# Patient Record
Sex: Female | Born: 1965 | Race: White | Hispanic: No | Marital: Married | Smoking: Never smoker
Health system: Southern US, Community
[De-identification: ages and names within clinical notes are randomized; demographics above are authoritative.]

## PROBLEM LIST (undated history)

## (undated) DIAGNOSIS — M1990 Unspecified osteoarthritis, unspecified site: Secondary | ICD-10-CM

## (undated) DIAGNOSIS — C801 Malignant (primary) neoplasm, unspecified: Secondary | ICD-10-CM

---

## 2010-07-21 DIAGNOSIS — C801 Malignant (primary) neoplasm, unspecified: Secondary | ICD-10-CM

## 2010-07-21 HISTORY — DX: Malignant (primary) neoplasm, unspecified: C80.1

## 2010-07-21 HISTORY — PX: EXCISION MELANOMA WITH SENTINEL LYMPH NODE BIOPSY: SHX5628

## 2016-09-02 ENCOUNTER — Other Ambulatory Visit: Payer: Self-pay | Admitting: Obstetrics and Gynecology

## 2016-09-02 DIAGNOSIS — Z1231 Encounter for screening mammogram for malignant neoplasm of breast: Secondary | ICD-10-CM

## 2016-10-02 ENCOUNTER — Encounter: Payer: Self-pay | Admitting: Radiology

## 2016-10-02 ENCOUNTER — Ambulatory Visit
Admission: RE | Admit: 2016-10-02 | Discharge: 2016-10-02 | Disposition: A | Discharge status: Home / Self Care | Payer: BLUE CROSS/BLUE SHIELD | Source: Ambulatory Visit | Attending: Obstetrics and Gynecology | Admitting: Obstetrics and Gynecology

## 2016-10-02 DIAGNOSIS — Z1231 Encounter for screening mammogram for malignant neoplasm of breast: Secondary | ICD-10-CM

## 2016-10-06 ENCOUNTER — Inpatient Hospital Stay
Admission: RE | Admit: 2016-10-06 | Discharge: 2016-10-06 | Disposition: A | Discharge status: Home / Self Care | Payer: Self-pay | Source: Ambulatory Visit | Attending: *Deleted | Admitting: *Deleted

## 2016-10-06 ENCOUNTER — Other Ambulatory Visit: Payer: Self-pay | Admitting: *Deleted

## 2016-10-06 DIAGNOSIS — Z1231 Encounter for screening mammogram for malignant neoplasm of breast: Secondary | ICD-10-CM

## 2016-10-14 ENCOUNTER — Other Ambulatory Visit: Payer: Self-pay | Admitting: Obstetrics and Gynecology

## 2016-10-14 DIAGNOSIS — R928 Other abnormal and inconclusive findings on diagnostic imaging of breast: Secondary | ICD-10-CM

## 2016-10-14 DIAGNOSIS — R921 Mammographic calcification found on diagnostic imaging of breast: Secondary | ICD-10-CM

## 2016-10-16 ENCOUNTER — Ambulatory Visit
Admission: RE | Admit: 2016-10-16 | Discharge: 2016-10-16 | Disposition: A | Discharge status: Home / Self Care | Payer: BLUE CROSS/BLUE SHIELD | Source: Ambulatory Visit | Attending: Obstetrics and Gynecology | Admitting: Obstetrics and Gynecology

## 2016-10-16 DIAGNOSIS — R921 Mammographic calcification found on diagnostic imaging of breast: Secondary | ICD-10-CM

## 2016-10-16 DIAGNOSIS — R928 Other abnormal and inconclusive findings on diagnostic imaging of breast: Secondary | ICD-10-CM

## 2016-10-27 ENCOUNTER — Other Ambulatory Visit: Payer: Self-pay | Admitting: Obstetrics and Gynecology

## 2016-10-27 DIAGNOSIS — R921 Mammographic calcification found on diagnostic imaging of breast: Secondary | ICD-10-CM

## 2016-10-27 DIAGNOSIS — C000 Malignant neoplasm of external upper lip: Secondary | ICD-10-CM

## 2017-01-05 ENCOUNTER — Encounter
Admission: RE | Admit: 2017-01-05 | Discharge: 2017-01-05 | Disposition: A | Discharge status: Home / Self Care | Payer: BLUE CROSS/BLUE SHIELD | Source: Ambulatory Visit | Attending: Obstetrics and Gynecology | Admitting: Obstetrics and Gynecology

## 2017-01-05 DIAGNOSIS — Z01812 Encounter for preprocedural laboratory examination: Secondary | ICD-10-CM | POA: Insufficient documentation

## 2017-01-05 HISTORY — DX: Unspecified osteoarthritis, unspecified site: M19.90

## 2017-01-05 HISTORY — DX: Malignant (primary) neoplasm, unspecified: C80.1

## 2017-01-05 LAB — BASIC METABOLIC PANEL
Anion gap: 4 — ABNORMAL LOW (ref 5–15)
BUN: 13 mg/dL (ref 6–20)
CHLORIDE: 105 mmol/L (ref 101–111)
CO2: 26 mmol/L (ref 22–32)
CREATININE: 0.82 mg/dL (ref 0.44–1.00)
Calcium: 9.5 mg/dL (ref 8.9–10.3)
GFR calc Af Amer: >60 mL/min (ref >60)
GFR calc non Af Amer: >60 mL/min (ref >60)
Glucose, Bld: 88 mg/dL (ref 65–99)
Potassium: 4 mmol/L (ref 3.5–5.1)
SODIUM: 135 mmol/L (ref 135–145)

## 2017-01-05 LAB — CBC
HCT: 32.3 % — ABNORMAL LOW (ref 35.0–47.0)
Hemoglobin: 10.3 g/dL — ABNORMAL LOW (ref 12.0–16.0)
MCH: 25.3 pg — AB (ref 26.0–34.0)
MCHC: 31.8 g/dL — ABNORMAL LOW (ref 32.0–36.0)
MCV: 79.6 fL — AB (ref 80.0–100.0)
PLATELETS: 355 10*3/uL (ref 150–440)
RBC: 4.06 MIL/uL (ref 3.80–5.20)
RDW: 14 % (ref 11.5–14.5)
WBC: 7.9 10*3/uL (ref 3.6–11.0)

## 2017-01-05 NOTE — Patient Instructions (Signed)
  Your procedure is scheduled on: January 12, 2017 [facility]) Report to Same Day Surgery 2nd floor medical mall ([place] Entrance-take elevator on left to 2nd floor.  Check in with surgery information desk.) To find out your arrival time please call [phone] between 1PM - 3PM on January 09, 2017 (FRIDAY)   Remember: Instructions that are not followed completely may result in serious medical risk, up to and including death, or upon the discretion of your surgeon and anesthesiologist your surgery may need to be rescheduled.    _x___ 1. Do not eat food or drink liquids after midnight. No gum chewing or hard candies                                __x__ 2. No Alcohol for 24 hours before or after surgery.   __x__3. No Smoking for 24 prior to surgery.   ____  4. Bring all medications with you on the day of surgery if instructed.    __x__ 5. Notify your doctor if there is any change in your medical condition     (cold, fever, infections).     Do not wear jewelry, make-up, hairpins, clips or nail polish.  Do not wear lotions, powders, or perfumes.   Do not shave 48 hours prior to surgery. Men may shave face and neck.  Do not bring valuables to the hospital.    [facility] is not responsible for any belongings or valuables.               Contacts, dentures or bridgework may not be worn into surgery.  Leave your suitcase in the car. After surgery it may be brought to your room.  For patients admitted to the hospital, discharge time is determined by your treatment team                  .   Patients discharged the day of surgery will not be allowed to drive home.  You will need someone to drive you home and stay with you the night of your procedure.    Please read over the following fact sheets that you were given:   [facility] Preparing for Surgery and or MRSA Information   ___ Take anti-hypertensive (unless it includes a diuretic), cardiac, seizure, asthma,     anti-reflux and psychiatric  medicines. These include:  1.   2.  3.  4.  5.  6.  ____Fleets enema or Magnesium Citrate as directed.   _x___ Use CHG Soap or sage wipes as directed on instruction sheet   ____ Use inhalers on the day of surgery and bring to hospital day of surgery  ____ Stop Metformin and Janumet 2 days prior to surgery.    ____ Take 1/2 of usual insulin dose the night before surgery and none on the morning surgery     _x___ Follow recommendations from Cardiologist, Pulmonologist or PCP regarding          stopping Aspirin, Coumadin, Plavix ,Eliquis, Effient, or Pradaxa, and Pletal.  X____Stop Anti-inflammatories such as Advil, Aleve, Ibuprofen, Motrin, Naproxen, Naprosyn, Goodies powders or aspirin products. OK to take Tylenol   _x___ Stop supplements until after surgery.  But may continue Vitamin D, Vitamin B, and multivitamin       ____ Bring C-Pap to the hospital.

## 2017-01-11 MED ORDER — CEFAZOLIN SODIUM-DEXTROSE 2-4 GM/100ML-% IV SOLN
2.0000 g | INTRAVENOUS | Status: AC
  Administered 2017-01-12: 2 g via INTRAVENOUS

## 2017-01-12 ENCOUNTER — Ambulatory Visit
Admission: RE | Admit: 2017-01-12 | Discharge: 2017-01-12 | Disposition: A | Discharge status: Home / Self Care | Payer: BLUE CROSS/BLUE SHIELD | Source: Ambulatory Visit | Attending: Obstetrics and Gynecology | Admitting: Obstetrics and Gynecology

## 2017-01-12 ENCOUNTER — Ambulatory Visit: Payer: BLUE CROSS/BLUE SHIELD | Admitting: Anesthesiology

## 2017-01-12 ENCOUNTER — Encounter: Payer: Self-pay | Admitting: *Deleted

## 2017-01-12 ENCOUNTER — Encounter
Admission: RE | Disposition: A | Discharge status: Home / Self Care | Payer: Self-pay | Source: Ambulatory Visit | Attending: Obstetrics and Gynecology

## 2017-01-12 DIAGNOSIS — N800 Endometriosis of uterus: Secondary | ICD-10-CM | POA: Diagnosis not present

## 2017-01-12 DIAGNOSIS — Z85820 Personal history of malignant melanoma of skin: Secondary | ICD-10-CM | POA: Diagnosis not present

## 2017-01-12 DIAGNOSIS — D250 Submucous leiomyoma of uterus: Secondary | ICD-10-CM | POA: Insufficient documentation

## 2017-01-12 DIAGNOSIS — D252 Subserosal leiomyoma of uterus: Secondary | ICD-10-CM | POA: Diagnosis not present

## 2017-01-12 DIAGNOSIS — D251 Intramural leiomyoma of uterus: Secondary | ICD-10-CM | POA: Diagnosis not present

## 2017-01-12 DIAGNOSIS — N920 Excessive and frequent menstruation with regular cycle: Secondary | ICD-10-CM | POA: Diagnosis present

## 2017-01-12 HISTORY — PX: CYSTOSCOPY: SHX5120

## 2017-01-12 HISTORY — PX: BILATERAL SALPINGECTOMY: SHX5743

## 2017-01-12 HISTORY — PX: VAGINAL HYSTERECTOMY: SHX2639

## 2017-01-12 LAB — CBC WITH DIFFERENTIAL/PLATELET
Basophils Absolute: 0.1 10*3/uL (ref 0–0.1)
Basophils Relative: 1 %
EOS ABS: 0 10*3/uL (ref 0–0.7)
EOS PCT: 0 %
HCT: 24.9 % — ABNORMAL LOW (ref 35.0–47.0)
HEMOGLOBIN: 8 g/dL — AB (ref 12.0–16.0)
LYMPHS ABS: 1 10*3/uL (ref 1.0–3.6)
Lymphocytes Relative: 15 %
MCH: 25.5 pg — AB (ref 26.0–34.0)
MCHC: 31.9 g/dL — AB (ref 32.0–36.0)
MCV: 80 fL (ref 80.0–100.0)
MONOS PCT: 2 %
Monocytes Absolute: 0.2 10*3/uL (ref 0.2–0.9)
NEUTROS PCT: 82 %
Neutro Abs: 5.6 10*3/uL (ref 1.4–6.5)
Platelets: 283 10*3/uL (ref 150–440)
RBC: 3.12 MIL/uL — AB (ref 3.80–5.20)
RDW: 14.5 % (ref 11.5–14.5)
WBC: 6.9 10*3/uL (ref 3.6–11.0)

## 2017-01-12 LAB — ABO/RH: ABO/RH(D): O POS

## 2017-01-12 LAB — HEMOGLOBIN: Hemoglobin: 8.9 g/dL — ABNORMAL LOW (ref 12.0–16.0)

## 2017-01-12 LAB — PREPARE RBC (CROSSMATCH)

## 2017-01-12 LAB — POCT PREGNANCY, URINE: Preg Test, Ur: NEGATIVE

## 2017-01-12 SURGERY — HYSTERECTOMY, VAGINAL
Anesthesia: General | Wound class: Clean Contaminated

## 2017-01-12 MED ORDER — ROCURONIUM BROMIDE 100 MG/10ML IV SOLN
INTRAVENOUS | Status: DC | PRN
Start: 2017-01-12 — End: 2017-01-12
  Administered 2017-01-12: 50 mg via INTRAVENOUS
  Administered 2017-01-12: 20 mg via INTRAVENOUS
  Administered 2017-01-12: 10 mg via INTRAVENOUS
  Administered 2017-01-12: 20 mg via INTRAVENOUS

## 2017-01-12 MED ORDER — LIDOCAINE HCL (CARDIAC) 20 MG/ML IV SOLN
INTRAVENOUS | Status: DC | PRN
  Administered 2017-01-12: 60 mg via INTRAVENOUS

## 2017-01-12 MED ORDER — DEXAMETHASONE SODIUM PHOSPHATE 10 MG/ML IJ SOLN
INTRAMUSCULAR | Status: DC | PRN
  Administered 2017-01-12: 10 mg via INTRAVENOUS

## 2017-01-12 MED ORDER — OXYCODONE HCL 5 MG/5ML PO SOLN: 5.0000 mg | Freq: Once | ORAL | Status: DC | PRN

## 2017-01-12 MED ORDER — GABAPENTIN 300 MG PO CAPS
600.0000 mg | ORAL_CAPSULE | Freq: Once | ORAL | Status: AC
  Administered 2017-01-12: 600 mg via ORAL

## 2017-01-12 MED ORDER — EPHEDRINE SULFATE 50 MG/ML IJ SOLN
INTRAMUSCULAR | Status: AC
  Filled 2017-01-12: qty 1

## 2017-01-12 MED ORDER — FENTANYL CITRATE (PF) 100 MCG/2ML IJ SOLN
INTRAMUSCULAR | Status: AC
  Administered 2017-01-12: 25 ug via INTRAVENOUS
  Filled 2017-01-12: qty 2

## 2017-01-12 MED ORDER — CEFAZOLIN SODIUM-DEXTROSE 2-4 GM/100ML-% IV SOLN
INTRAVENOUS | Status: AC
  Filled 2017-01-12: qty 100

## 2017-01-12 MED ORDER — MIDAZOLAM HCL 2 MG/2ML IJ SOLN
INTRAMUSCULAR | Status: DC | PRN
  Administered 2017-01-12: 2 mg via INTRAVENOUS

## 2017-01-12 MED ORDER — ACETAMINOPHEN 500 MG PO TABS: 1000.0000 mg | ORAL_TABLET | Freq: Four times a day (QID) | ORAL | 0 refills | Status: AC

## 2017-01-12 MED ORDER — OXYCODONE HCL 5 MG PO TABS: 5.0000 mg | ORAL_TABLET | Freq: Once | ORAL | Status: DC | PRN

## 2017-01-12 MED ORDER — METHYLENE BLUE 0.5 % INJ SOLN
INTRAVENOUS | Status: AC
  Filled 2017-01-12: qty 10

## 2017-01-12 MED ORDER — FUROSEMIDE 10 MG/ML IJ SOLN
INTRAMUSCULAR | Status: AC
  Filled 2017-01-12: qty 4

## 2017-01-12 MED ORDER — ONDANSETRON HCL 4 MG/2ML IJ SOLN
INTRAMUSCULAR | Status: AC
  Filled 2017-01-12: qty 2

## 2017-01-12 MED ORDER — FENTANYL CITRATE (PF) 100 MCG/2ML IJ SOLN
INTRAMUSCULAR | Status: DC | PRN
Start: 2017-01-12 — End: 2017-01-12
  Administered 2017-01-12 (×4): 50 ug via INTRAVENOUS

## 2017-01-12 MED ORDER — PHENYLEPHRINE HCL 10 MG/ML IJ SOLN
INTRAMUSCULAR | Status: DC | PRN
  Administered 2017-01-12: 100 ug via INTRAVENOUS

## 2017-01-12 MED ORDER — LIDOCAINE-EPINEPHRINE 1 %-1:100000 IJ SOLN
INTRAMUSCULAR | Status: AC
  Filled 2017-01-12: qty 1

## 2017-01-12 MED ORDER — FAMOTIDINE 20 MG PO TABS
ORAL_TABLET | ORAL | Status: AC
  Administered 2017-01-12: 20 mg via ORAL
  Filled 2017-01-12: qty 1

## 2017-01-12 MED ORDER — GABAPENTIN 300 MG PO CAPS
ORAL_CAPSULE | ORAL | Status: AC
  Administered 2017-01-12: 600 mg via ORAL
  Filled 2017-01-12: qty 2

## 2017-01-12 MED ORDER — ACETAMINOPHEN 500 MG PO TABS
1000.0000 mg | ORAL_TABLET | Freq: Once | ORAL | Status: AC
  Administered 2017-01-12: 1000 mg via ORAL

## 2017-01-12 MED ORDER — ROCURONIUM BROMIDE 50 MG/5ML IV SOLN
INTRAVENOUS | Status: AC
  Filled 2017-01-12: qty 1

## 2017-01-12 MED ORDER — LIDOCAINE-EPINEPHRINE 1 %-1:100000 IJ SOLN
INTRAMUSCULAR | Status: DC | PRN
  Administered 2017-01-12: 10 mL

## 2017-01-12 MED ORDER — GABAPENTIN 800 MG PO TABS: 800.0000 mg | ORAL_TABLET | Freq: Every day | ORAL | 0 refills | Status: AC

## 2017-01-12 MED ORDER — ACETAMINOPHEN 500 MG PO TABS
ORAL_TABLET | ORAL | Status: AC
  Administered 2017-01-12: 1000 mg via ORAL
  Filled 2017-01-12: qty 2

## 2017-01-12 MED ORDER — FAMOTIDINE 20 MG PO TABS
20.0000 mg | ORAL_TABLET | Freq: Once | ORAL | Status: AC
  Administered 2017-01-12: 20 mg via ORAL

## 2017-01-12 MED ORDER — OXYCODONE HCL 5 MG PO CAPS: 5.0000 mg | ORAL_CAPSULE | Freq: Four times a day (QID) | ORAL | 0 refills | Status: AC | PRN

## 2017-01-12 MED ORDER — PROMETHAZINE HCL 25 MG/ML IJ SOLN
INTRAMUSCULAR | Status: AC
  Filled 2017-01-12: qty 1

## 2017-01-12 MED ORDER — PROPOFOL 10 MG/ML IV BOLUS
INTRAVENOUS | Status: AC
  Filled 2017-01-12: qty 20

## 2017-01-12 MED ORDER — FUROSEMIDE 10 MG/ML IJ SOLN
INTRAMUSCULAR | Status: DC | PRN
  Administered 2017-01-12: 10 mg via INTRAMUSCULAR

## 2017-01-12 MED ORDER — ONDANSETRON HCL 4 MG/2ML IJ SOLN
INTRAMUSCULAR | Status: AC
  Administered 2017-01-12: 4 mg via INTRAVENOUS
  Filled 2017-01-12: qty 2

## 2017-01-12 MED ORDER — LIDOCAINE HCL (PF) 2 % IJ SOLN
INTRAMUSCULAR | Status: AC
  Filled 2017-01-12: qty 2

## 2017-01-12 MED ORDER — PROPOFOL 10 MG/ML IV BOLUS
INTRAVENOUS | Status: DC | PRN
  Administered 2017-01-12: 130 mg via INTRAVENOUS
  Administered 2017-01-12: 70 mg via INTRAVENOUS

## 2017-01-12 MED ORDER — ONDANSETRON HCL 4 MG/2ML IJ SOLN
4.0000 mg | Freq: Once | INTRAMUSCULAR | Status: AC
  Administered 2017-01-12 (×2): 4 mg via INTRAVENOUS

## 2017-01-12 MED ORDER — IBUPROFEN 800 MG PO TABS: 800.0000 mg | ORAL_TABLET | Freq: Three times a day (TID) | ORAL | 0 refills | Status: AC

## 2017-01-12 MED ORDER — DEXAMETHASONE SODIUM PHOSPHATE 10 MG/ML IJ SOLN
INTRAMUSCULAR | Status: AC
  Filled 2017-01-12: qty 1

## 2017-01-12 MED ORDER — BUPIVACAINE HCL (PF) 0.5 % IJ SOLN
INTRAMUSCULAR | Status: AC
  Filled 2017-01-12: qty 30

## 2017-01-12 MED ORDER — LACTATED RINGERS IV SOLN
INTRAVENOUS | Status: DC
  Administered 2017-01-12 (×5): via INTRAVENOUS

## 2017-01-12 MED ORDER — SUGAMMADEX SODIUM 200 MG/2ML IV SOLN
INTRAVENOUS | Status: AC
  Filled 2017-01-12: qty 2

## 2017-01-12 MED ORDER — SODIUM CHLORIDE 0.9 % IJ SOLN
INTRAMUSCULAR | Status: AC
  Filled 2017-01-12: qty 10

## 2017-01-12 MED ORDER — SUGAMMADEX SODIUM 200 MG/2ML IV SOLN
INTRAVENOUS | Status: DC | PRN
  Administered 2017-01-12: 130 mg via INTRAVENOUS

## 2017-01-12 MED ORDER — MIDAZOLAM HCL 2 MG/2ML IJ SOLN
INTRAMUSCULAR | Status: AC
  Filled 2017-01-12: qty 2

## 2017-01-12 MED ORDER — PROMETHAZINE HCL 25 MG/ML IJ SOLN
6.2500 mg | Freq: Once | INTRAMUSCULAR | Status: AC
  Administered 2017-01-12: 6.25 mg via INTRAVENOUS

## 2017-01-12 MED ORDER — EPHEDRINE SULFATE 50 MG/ML IJ SOLN
INTRAMUSCULAR | Status: DC | PRN
  Administered 2017-01-12 (×3): 10 mg via INTRAVENOUS

## 2017-01-12 MED ORDER — FENTANYL CITRATE (PF) 250 MCG/5ML IJ SOLN
INTRAMUSCULAR | Status: AC
  Filled 2017-01-12: qty 5

## 2017-01-12 MED ORDER — ONDANSETRON HCL 4 MG/2ML IJ SOLN
INTRAMUSCULAR | Status: DC | PRN
  Administered 2017-01-12: 4 mg via INTRAVENOUS

## 2017-01-12 MED ORDER — FENTANYL CITRATE (PF) 100 MCG/2ML IJ SOLN
25.0000 ug | INTRAMUSCULAR | Status: DC | PRN
  Administered 2017-01-12 (×2): 25 ug via INTRAVENOUS

## 2017-01-12 SURGICAL SUPPLY — 71 items
BAG URO DRAIN 2000ML W/SPOUT (MISCELLANEOUS) ×4 IMPLANT
BLADE SURG SZ11 CARB STEEL (BLADE) ×4 IMPLANT
CATH FOLEY 2WAY  5CC 16FR (CATHETERS) ×1
CATH ROBINSON RED A/P 16FR (CATHETERS) IMPLANT
CATH URTH 16FR FL 2W BLN LF (CATHETERS) ×3 IMPLANT
CHLORAPREP W/TINT 26ML (MISCELLANEOUS) ×4 IMPLANT
CLEANER CAUTERY TIP 5X5 PAD (MISCELLANEOUS) ×3 IMPLANT
CORD MONOPOLAR M/FML 12FT (MISCELLANEOUS) IMPLANT
COUNTER NEEDLE 20/40 LG (NEEDLE) ×4 IMPLANT
DEFOGGER SCOPE WARMER CLEARIFY (MISCELLANEOUS) IMPLANT
DERMABOND ADVANCED (GAUZE/BANDAGES/DRESSINGS) ×1
DERMABOND ADVANCED .7 DNX12 (GAUZE/BANDAGES/DRESSINGS) ×3 IMPLANT
DRSG TEGADERM 2-3/8X2-3/4 SM (GAUZE/BANDAGES/DRESSINGS) IMPLANT
ENDOPOUCH RETRIEVER 10 (MISCELLANEOUS) IMPLANT
GAUZE SPONGE NON-WVN 2X2 STRL (MISCELLANEOUS) IMPLANT
GLOVE BIO SURGEON STRL SZ7 (GLOVE) ×8 IMPLANT
GLOVE INDICATOR 7.5 STRL GRN (GLOVE) ×4 IMPLANT
GOWN STRL REUS W/ TWL LRG LVL3 (GOWN DISPOSABLE) ×12 IMPLANT
GOWN STRL REUS W/ TWL XL LVL3 (GOWN DISPOSABLE) ×3 IMPLANT
GOWN STRL REUS W/TWL LRG LVL3 (GOWN DISPOSABLE) ×4
GOWN STRL REUS W/TWL XL LVL3 (GOWN DISPOSABLE) ×1
HANDLE YANKAUER SUCT BULB TIP (MISCELLANEOUS) ×4 IMPLANT
IRRIGATION STRYKERFLOW (MISCELLANEOUS) IMPLANT
IRRIGATOR STRYKERFLOW (MISCELLANEOUS)
IV NS 1000ML (IV SOLUTION)
IV NS 1000ML BAXH (IV SOLUTION) IMPLANT
KIT PINK PAD W/HEAD ARE REST (MISCELLANEOUS) ×4
KIT PINK PAD W/HEAD ARM REST (MISCELLANEOUS) ×3 IMPLANT
KIT RM TURNOVER CYSTO AR (KITS) ×4 IMPLANT
LABEL OR SOLS (LABEL) ×4 IMPLANT
LIGASURE BLUNT 5MM 37CM (INSTRUMENTS) IMPLANT
LIGASURE LAP MARYLAND 5MM 37CM (ELECTROSURGICAL) IMPLANT
MANIPULATOR VCARE LG CRV RETR (MISCELLANEOUS) IMPLANT
MANIPULATOR VCARE SML CRV RETR (MISCELLANEOUS) IMPLANT
MANIPULATOR VCARE STD CRV RETR (MISCELLANEOUS) IMPLANT
NEEDLE VERESS 14GA 120MM (NEEDLE) IMPLANT
NS IRRIG 500ML POUR BTL (IV SOLUTION) ×4 IMPLANT
OCCLUDER COLPOPNEUMO (BALLOONS) IMPLANT
PACK GYN LAPAROSCOPIC (MISCELLANEOUS) ×4 IMPLANT
PAD CLEANER CAUTERY TIP 5X5 (MISCELLANEOUS) ×1
PAD OB MATERNITY 4.3X12.25 (PERSONAL CARE ITEMS) ×4 IMPLANT
PAD PREP 24X41 OB/GYN DISP (PERSONAL CARE ITEMS) ×4 IMPLANT
PENCIL ELECTRO HAND CTR (MISCELLANEOUS) ×4 IMPLANT
SCISSORS METZENBAUM CVD 33 (INSTRUMENTS) IMPLANT
SET CYSTO W/LG BORE CLAMP LF (SET/KITS/TRAYS/PACK) ×4 IMPLANT
SLEEVE ENDOPATH XCEL 5M (ENDOMECHANICALS) IMPLANT
SPONGE VERSALON 2X2 STRL (MISCELLANEOUS)
STRIP CLOSURE SKIN 1/4X4 (GAUZE/BANDAGES/DRESSINGS) IMPLANT
SUT MNCRL 4-0 (SUTURE)
SUT MNCRL 4-0 27XMFL (SUTURE)
SUT MNCRL AB 4-0 PS2 18 (SUTURE) IMPLANT
SUT PDS AB 2-0 CT1 27 (SUTURE) IMPLANT
SUT PDS PLUS 2 (SUTURE) ×2
SUT PDS PLUS AB 2-0 CT-1 (SUTURE) ×6 IMPLANT
SUT VIC AB 0 CT1 27 (SUTURE) ×4
SUT VIC AB 0 CT1 27XCR 8 STRN (SUTURE) ×12 IMPLANT
SUT VIC AB 0 CT1 36 (SUTURE) ×4 IMPLANT
SUT VIC AB 2-0 UR6 27 (SUTURE) IMPLANT
SUT VIC AB 3-0 SH 27 (SUTURE) ×3
SUT VIC AB 3-0 SH 27X BRD (SUTURE) ×9 IMPLANT
SUT VIC AB 4-0 SH 27 (SUTURE)
SUT VIC AB 4-0 SH 27XANBCTRL (SUTURE) IMPLANT
SUTURE MNCRL 4-0 27XMF (SUTURE) IMPLANT
SWABSTK COMLB BENZOIN TINCTURE (MISCELLANEOUS) IMPLANT
SYR 50ML LL SCALE MARK (SYRINGE) IMPLANT
SYRINGE 10CC LL (SYRINGE) ×4 IMPLANT
TROCAR ENDO BLADELESS 11MM (ENDOMECHANICALS) IMPLANT
TROCAR XCEL NON-BLD 5MMX100MML (ENDOMECHANICALS) IMPLANT
TROCAR XCEL UNIV SLVE 11M 100M (ENDOMECHANICALS) IMPLANT
TUBING INSUF HEATED (TUBING) ×4 IMPLANT
TUBING INSUFFLATOR HI FLOW (MISCELLANEOUS) IMPLANT

## 2017-01-12 NOTE — Progress Notes (Signed)
zofran given for nausea  

## 2017-01-12 NOTE — Progress Notes (Signed)
zofran helped

## 2017-01-12 NOTE — Discharge Instructions (Addendum)
Discharge instructions after  vaginal hysterectomy  Signs and Symptoms to Report Call our office at [phone] if you have any of the following.   Fever over 100.4 degrees or higher  Severe stomach pain not relieved with pain medications  Bright red bleeding thats heavier than a period that does not slow with rest  To go the bathroom a lot (frequency), you cant hold your urine (urgency), or it hurts when you empty your bladder (urinate)  Chest pain  Shortness of breath  Pain in the calves of your legs  Severe nausea and vomiting not relieved with anti-nausea medications  Signs of infection around your wounds, such as redness, hot to touch, swelling, green/yellow drainage (like pus), bad smelling discharge  Any concerns  What You Can Expect after Surgery  You may see some pink tinged, bloody fluid and bruising around the wound. This is normal.  You may have a sore throat because of the tube in your mouth during general anesthesia. This will go away in 2 to 3 days.  You may have some stomach cramps.  You may notice spotting on your panties.   Activities after Your Discharge Follow these guidelines to help speed your recovery at home:  Do the coughing and deep breathing as you did in the hospital for 2 weeks. Use the small blue breathing device, called the incentive spirometer for 2 weeks.  Dont drive if you are in pain or taking narcotic pain medicine. You may drive when you can safely slam on the brakes, turn the wheel forcefully, and rotate your torso comfortably. This is typically 1-2 weeks. Practice in a parking lot or side street prior to attempting to drive regularly.   Ask others to help with household chores for 4 weeks.  Do not lift anything heavier that 10 pounds for 4-6 weeks. This includes pets, children, and groceries.  Dont do strenuous activities, exercises, or sports like vacuuming, tennis, squash, etc. until your doctor says it is safe to do  so. ---Maintain pelvic rest for 8 weeks. This means nothing in the vagina at all (no douching, tampons, intercourse) for 8 weeks.   Walk as you feel able. Rest often since it may take two or three weeks for your energy level to return to normal.   You may climb stairs  Avoid constipation:   -Eat fruits, vegetables, and whole grains. Eat small meals as your appetite will take time to return to normal.   -Drink 6 to 8 glasses of water each day unless your doctor has told you to limit your fluids.   -Use a laxative or stool softener as needed if constipation becomes a problem. You may take Miralax, metamucil, Citrucil, Colace, Senekot, FiberCon, etc. If this does not relieve the constipation, try two tablespoons of Milk Of Magnesia every 8 hours until your bowels move.   You may shower. Gently wash the wounds with a mild soap and water. Pat dry.  Do not get in a hot tub, swimming pool, etc. for 6 weeks.  Do not use lotions, oils, powders on the wounds.  Do not douche, use tampons, or have sex until your doctor says it is okay.  Take your pain medicine when you need it. The medicine may not work as well if the pain is bad.  Take the medicines you were taking before surgery. Other medications you will need are pain medications (Norco or Percocet) and nausea medications (Zofran).     AMBULATORY SURGERY  DISCHARGE INSTRUCTIONS  1) The drugs that you were given will stay in your system until tomorrow so for the next 24 hours you should not:  A) Drive an automobile B) Make any legal decisions C) Drink any alcoholic beverage   2) You may resume regular meals tomorrow.  Today it is better to start with liquids and gradually work up to solid foods.  You may eat anything you prefer, but it is better to start with liquids, then soup and crackers, and gradually work up to solid foods.   3) Please notify your doctor immediately if you have any unusual bleeding, trouble breathing, redness  and pain at the surgery site, drainage, fever, or pain not relieved by medication.    4) Additional Instructions:        Please contact your physician with any problems or Same Day Surgery at [phone], Monday through Friday 6 am to 4 pm, or [place] [facility] number at [phone].

## 2017-01-12 NOTE — Progress Notes (Signed)
Pt states she feels better after vomiting   Taking ice chips well

## 2017-01-12 NOTE — Progress Notes (Signed)
Called results to dr Leafy Ro

## 2017-01-12 NOTE — Anesthesia Preprocedure Evaluation (Signed)
Anesthesia Evaluation  Patient identified by MRN, date of birth, ID band Patient awake    Reviewed: Allergy & Precautions, H&P , NPO status , Patient's Chart, lab work & pertinent test results  History of Anesthesia Complications Negative for: history of anesthetic complications  Airway Mallampati: III  TM Distance: <3 FB Neck ROM: full    Dental  (+) Chipped   Pulmonary neg pulmonary ROS, neg shortness of breath,           Cardiovascular Exercise Tolerance: Good (-) angina(-) Past MI and (-) DOE negative cardio ROS       Neuro/Psych negative neurological ROS  negative psych ROS   GI/Hepatic negative GI ROS, Neg liver ROS, neg GERD  ,  Endo/Other  negative endocrine ROS  Renal/GU      Musculoskeletal   Abdominal   Peds  Hematology negative hematology ROS (+)   Anesthesia Other Findings Past Medical History: No date: Arthritis 2012: Cancer ([place])     Comment: Melanoma Left Arm  Past Surgical History: 2012: EXCISION MELANOMA WITH SENTINEL LYMPH NODE BIO* Left     Comment: [facility]  BMI    Body Mass Index:  22.44 kg/m      Reproductive/Obstetrics negative OB ROS                             Anesthesia Physical Anesthesia Plan  ASA: II  Anesthesia Plan: General ETT   Post-op Pain Management:    Induction: Intravenous  PONV Risk Score and Plan: 4 or greater and Ondansetron, Dexamethasone, Propofol and Midazolam  Airway Management Planned: Oral ETT  Additional Equipment:   Intra-op Plan:   Post-operative Plan: Extubation in OR  Informed Consent: I have reviewed the patients History and Physical, chart, labs and discussed the procedure including the risks, benefits and alternatives for the proposed anesthesia with the patient or authorized representative who has indicated his/her understanding and acceptance.   Dental Advisory Given  Plan Discussed with:  Anesthesiologist, CRNA and Surgeon  Anesthesia Plan Comments: (Patient consented for risks of anesthesia including but not limited to:  - adverse reactions to medications - damage to teeth, lips or other oral mucosa - sore throat or hoarseness - Damage to heart, brain, lungs or loss of life  Patient voiced understanding.)        Anesthesia Quick Evaluation

## 2017-01-12 NOTE — Progress Notes (Signed)
Incontinent  when coughs

## 2017-01-12 NOTE — Anesthesia Procedure Notes (Signed)
Procedure Name: Intubation Date/Time: 01/12/2017 11:40 AM Performed by: Silvana Newness Pre-anesthesia Checklist: Patient identified, Emergency Drugs available, Suction available, Patient being monitored and Timeout performed Patient Re-evaluated:Patient Re-evaluated prior to inductionOxygen Delivery Method: Circle system utilized Preoxygenation: Pre-oxygenation with 100% oxygen Intubation Type: IV induction Ventilation: Mask ventilation without difficulty Laryngoscope Size: Mac and 3 Grade View: Grade I Tube type: Oral Tube size: 7.0 mm Number of attempts: 1 Airway Equipment and Method: Stylet Placement Confirmation: ETT inserted through vocal cords under direct vision,  positive ETCO2 and breath sounds checked- equal and bilateral Secured at: 19 cm Tube secured with: Tape Dental Injury: Teeth and Oropharynx as per pre-operative assessment

## 2017-01-12 NOTE — Progress Notes (Signed)
Blood drawn.

## 2017-01-12 NOTE — Progress Notes (Signed)
Dr Leafy Ro in see pt  Aware of hemogoblin

## 2017-01-12 NOTE — OR Nursing (Signed)
Dr. Leafy Ro in to see pt and advises ok to d/c to home, pt without nausea, sipping g ale/ice chips, few bites saltines.  Will d/c when pt comes back from pharmacy.

## 2017-01-12 NOTE — Progress Notes (Signed)
Incontinent of urine

## 2017-01-12 NOTE — Transfer of Care (Signed)
Immediate Anesthesia Transfer of Care Note  Patient: Veronica Walsh  Procedure(s) Performed: Procedure(s): HYSTERECTOMY VAGINAL BILATERAL SALPINGECTOMY CYSTOSCOPY  Patient Location: PACU  Anesthesia Type:General  Level of Consciousness: awake, drowsy and patient cooperative  Airway & Oxygen Therapy: Patient Spontanous Breathing and Patient connected to face mask oxygen  Post-op Assessment: Report given to RN, Post -op Vital signs reviewed and stable and Patient moving all extremities X 4  Post vital signs: Reviewed and stable  Last Vitals:  Vitals:   01/12/17 0907 01/12/17 1505  BP: 126/68   Pulse: (!) 58 96  Resp: 16 13  Temp: 36.6 C 36.2 C    Last Pain:  Vitals:   01/12/17 0907  TempSrc: Oral         Complications: No apparent anesthesia complications

## 2017-01-12 NOTE — Progress Notes (Signed)
Pt stated that she is not hurting enough for pain medication but there is pressure  Has voided a few times

## 2017-01-12 NOTE — Op Note (Signed)
Jerl Santos PROCEDURE DATE: 01/12/2017  PREOPERATIVE DIAGNOSIS:   Abnormal uterine bleeding - fibroids, failed medical management  POSTOPERATIVE DIAGNOSIS:   Same  SURGEON:   Benjaman Kindler, M.D. ASSISTANT: Larey Days, M.D. OPERATION:  Total Vaginal Hysterectomy, bilateral salpingectomy, cystoscopy  ANESTHESIA:  General endotracheal. Anesthesiologist: Anesthesiologist: Piscitello, Precious Haws, MD CRNA: Allean Found, CRNA; Silvana Newness, CRNA  INDICATIONS: The patient is a 51 y.o. with history of AUB-F. The patient made a decision to undergo definite surgical treatment. On the preoperative visit, the risks, benefits, indications, and alternatives of the procedure were reviewed with the patient.  On the day of surgery, the risks of surgery were again discussed with the patient including but not limited to: bleeding which may require transfusion or reoperation; infection which may require antibiotics; injury to bowel, bladder, ureters or other surrounding organs; need for additional procedures; thromboembolic phenomenon, incisional problems and other postoperative/anesthesia complications. Written informed consent was obtained.    OPERATIVE FINDINGS: A 10 week size uterus with normal tubes and ovaries bilaterally.  ESTIMATED BLOOD LOSS: 1200 ml FLUIDS:  2000 ml of Lactated Ringers URINE OUTPUT:  500 ml of clear yellow urine. SPECIMENS:  Uterus and cervix  sent to pathology COMPLICATIONS:  Blood loss during fibroid resection, with an intraop CBC drawn. Her initial H/H 10.3/32.3; intraop, 8.0/24.9. The decision was made to proceed with transfusion in the PACU.  DESCRIPTION OF PROCEDURE:  The patient received prophylactic intravenous antibiotics and had sequential compression devices applied to her lower extremities while in the preoperative area.    She was taken to the operating room, where she was identified by name and birth date. General anesthesia was administered and was found to  be adequate.  She was placed in the dorsal lithotomy position, and was prepped and draped in a sterile manner.  A formal time out procedure was performed with all team members present and in agreement. A Foley catheter was inserted into her bladder and attached to gravity drainage. Attention was turned to her pelvis. Of note, all sutures used in this case were 0 Vicryl unless otherwise noted.     A weighted speculum was placed in the vagina, and the anterior and posterior lips of the cervix were grasped bilaterally with thyroid tenaculums.  The cervix was then injected circumferentially with 0.25% Marcaine with epinephrine solution to maintain hemostasis.  The cervix was circumferentially incised using electrocautery, and the posterior cul-de-sac was entered sharply in the midline.   A long weighted speculum was inserted into the posterior cul-de-sac. The bladder was dissected off the pubocervical fascia anteriorly with sharp and careful blunt dissection without complication.  The anterior cul-de-sac was then entered sharply without difficulty and the bladder retracted out of the operative field behind a retractor. The Heaney clamp was then used to clamp the uterosacral ligaments on either side.  They were then cut and sutured ligated with 0 Vicryl, and the ligated uterosacral ligaments were transfixed to the ipsilateral vaginal epithelium to further support the vagina and provide hemostasis. The cardinal ligaments were then clamped, cut and ligated. The uterine vessels and broad ligaments were then serially clamped with the Heaney clamps, cut, and suture ligated on both sides.  Because of the size of the fundal fibroid, the posterior uterus was delivered and cored out, with multiple fibroids and sections of the uterus removed. This stage of the hysterectomy is where the bleeding occurred. We moved quickly but safely, making sure to keep the bladder out of the field, and careful control  of the cornual pedicles.  Once the uterine ovarian ligaments were controlled, her bleeding became minimal. Excellent hemostasis was noted at this point.    The uterus was then delivered via the posterior cul-de-sac, and the cornua were clamped with the Heaney clamps, transected, and the uterine specimen was delivered and sent to pathology. These pedicles were then suture ligated to ensure hemostasis.   BILATERAL SALPINGECTOMY PARAGRAPH The Fallopian tubes were then identified and individually grasped with Babcock clamps. They were clamped across entirely with a Heaney clamp and free-tied, followed by suture ligation for excellent hemostasis bilaterally. The ovaries were left intact and in place.  After completion of the hysterectomy, all pedicles from the uterosacral ligament to the cornua were examined hemostasis was confirmed.  The peritoneum was closed in a purse string fashion with 2-0 PDS, taking care not to incorporate any intraabdominal organs in the closure.The vaginal cuff was then closed with in a running locked fashion with care given to incorporate the uterosacral pedicles bilaterally, which were also tied in the midline.  All instruments were then removed from the pelvis.  CYSTOCOPY PARAGRAPH The Foley catheter was temporarily removed and an uncomplicated cystoscopy was performed. Excellent efflux was noted from both ureteral orifices. The bladder mucosa was intact. The Foley catheter was replaced for postoperative care.  The patient tolerated the procedure well.  All instruments, needles, and sponge counts were correct x 2. The patient was taken to the recovery room in stable condition.    Angelina Pih, MD, MPH

## 2017-01-12 NOTE — Anesthesia Post-op Follow-up Note (Cosign Needed)
Anesthesia QCDR form completed.        

## 2017-01-12 NOTE — H&P (Signed)
Veronica Walsh is a 51 y.o. female presenting with Follow-up (f/u ocp's, fibroids)  on 12/04/2016  HPI: Hx of AUB- F (11cm uterus) and pelvic pressure. Trial of micronor x2 months unsuccessful, and bleeding and pain are worsening.  Ultrasound measured 3/18:  Ut anteverted Multiple fibroids seen: largest = 5.5cm, one fibroid seen within endometrial cavity = 2.1cm  Endo = 6.33mm  No FF seen in CDSs  Bilateral adns imaged, appear WNL Could not identify ovs  NSVD x1  Pap smear  DIAGNOSIS: - [facility] Comment   Final  NEGATIVE FOR INTRAEPITHELIAL LESION AND MALIGNANCY.  Specimen adequacy: - [facility] Comment   Final  Satisfactory for evaluation. Endocervical and/or squamous metaplastic  cells (endocervical component) are present.  Clinician provided ICD10: - [facility] Comment   Final  Z01.419  PERFORMED BY: - [facility] Comment   Final  Alwyn Ren, Cytotechnologist (ASCP)  . - [facility] .   Final  Note: - [facility] Comment   Final  The Pap smear is a screening test designed to aid in the detection of  premalignant and malignant conditions of the uterine cervix. It is not a  diagnostic procedure and should not be used as the sole means of detecting  cervical cancer. Both false-positive and false-negative reports do occur.  Test Methodology: - [facility] Comment   Final  This liquid based ThinPrep(R) pap test was screened with the  use of an image guided system.  HPV, high-risk - [facility] Negative  Negative Final  This high-risk HPV test detects thirteen high-risk types  (16/18/31/33/35/39/45/51/52/56/58/59/68) without differentiation.    Past Medical History:  has a past medical history of Melanoma in situ of upper extremity, left (CMS-HCC).  Past Surgical History:  has no past surgical history on file. Family History: family history is not on file. Social History:  reports that she has never smoked. She has never used smokeless tobacco. She reports that she does not drink  alcohol or use drugs. OB/GYN History:          OB History    Gravida Para Term Preterm AB Living   1 1 1     1    SAB TAB Ectopic Molar Multiple Live Births                    Allergies: has No Known Allergies. Medications:  Current Outpatient Prescriptions:  .  norethindrone (MICRONOR) 0.35 mg tablet, Take 1 tablet (0.35 mg total) by mouth once daily., Disp: 1 Package, Rfl: 11   Review of Systems: No SOB, no palpitations or chest pain, no new lower extremity edema, no nausea or vomiting or bowel or bladder complaints. See HPI for gyn specific ROS.   Exam:   BP (!) 131/90   Pulse 77   Ht 167.6 cm (5\' 6" )   Wt 65 kg (143 lb 3.2 oz)   BMI 23.11 kg/m   General: Patient is well-groomed, well-nourished, appears stated age in no acute distress  HEENT: head is atraumatic and normocephalic, trachea is midline, neck is supple with no palpable nodules  CV: Regular rhythm and normal heart rate, no murmur  Pulm: Clear to auscultation throughout lung fields with no wheezing, crackles, or rhonchi. No increased work of breathing  Abdomen: soft , no mass, non-tender, no rebound tenderness, no hepatomegaly  Pelvic: tanner stage 5 ,              External genitalia: vulva /labia no lesions  Urethra: no prolapse             Vagina: normal physiologic d/c, laxity in vaginal walls             Cervix: no lesions, no cervical motion tenderness, good descent             Uterus: normal size shape and contour, non-tender             Adnexa: no mass,  non-tender               Rectovaginal: External wnl   Impression:   The primary encounter diagnosis was Intramural, submucous, and subserous leiomyoma of uterus. Diagnoses of Excessive or frequent menstruation and Pelvic pain in female were also pertinent to this visit.    Plan:    She plans to proceed withtotal laparoscopic hysterectomy with bilateral salpingectomy  procedure. We will perform a  cystoscopy to evaluate the urinary tract after the procedure.   The patient and I discussed the technical aspects of the procedure including the potential for risks and complications.  These include but are not limited to the risk of infection requiring post-operative antibiotics or further procedures.  We talked about the risk of injury to adjacent organs including bladder, bowel, ureter, blood vessels or nerves.  We talked about the need to convert to an open incision.  We talked about the possible need for blood transfusion.  We talked about postop complications such as thromboembolic or cardiopulmonary complications.  All of her questions were answered.  Her preoperative exam was completed and the appropriate consents were signed. She is scheduled to undergo this procedure in the near future.

## 2017-01-12 NOTE — Progress Notes (Signed)
Vomited about 200cc of bile  States she feels better after she vomited   Clears throat frequently

## 2017-01-12 NOTE — Progress Notes (Signed)
Phenergan 6.25 given for nausea  

## 2017-01-13 ENCOUNTER — Encounter: Payer: Self-pay | Admitting: Obstetrics and Gynecology

## 2017-01-13 LAB — BPAM RBC
Blood Product Expiration Date: 201806272359
ISSUE DATE / TIME: 201806251503
Unit Type and Rh: 9500

## 2017-01-13 LAB — TYPE AND SCREEN
ABO/RH(D): O POS
Antibody Screen: NEGATIVE
Unit division: 0

## 2017-01-13 NOTE — Anesthesia Postprocedure Evaluation (Signed)
Anesthesia Post Note  Patient: Veronica Walsh  Procedure(s) Performed: Procedure(s): HYSTERECTOMY VAGINAL BILATERAL SALPINGECTOMY CYSTOSCOPY  Patient location during evaluation: PACU Anesthesia Type: General Level of consciousness: awake and alert Pain management: pain level controlled Vital Signs Assessment: post-procedure vital signs reviewed and stable Respiratory status: spontaneous breathing, nonlabored ventilation, respiratory function stable and patient connected to nasal cannula oxygen Cardiovascular status: blood pressure returned to baseline and stable Postop Assessment: no signs of nausea or vomiting Anesthetic complications: no     Last Vitals:  Vitals:   01/12/17 1820 01/12/17 1900  BP: 108/60 107/78  Pulse: (!) 50 (!) 59  Resp: 16 16  Temp: 36.4 C 36.4 C    Last Pain:  Vitals:   01/12/17 1900  TempSrc: Temporal  PainSc:                  Precious Haws Saurabh Hettich

## 2017-01-14 LAB — SURGICAL PATHOLOGY

## 2017-04-20 ENCOUNTER — Ambulatory Visit
Admission: RE | Admit: 2017-04-20 | Discharge: 2017-04-20 | Disposition: A | Discharge status: Home / Self Care | Payer: BLUE CROSS/BLUE SHIELD | Source: Ambulatory Visit | Attending: Obstetrics and Gynecology | Admitting: Obstetrics and Gynecology

## 2017-04-20 ENCOUNTER — Other Ambulatory Visit: Payer: Self-pay | Admitting: Obstetrics and Gynecology

## 2017-04-20 ENCOUNTER — Encounter (HOSPITAL_COMMUNITY): Payer: Self-pay

## 2017-04-20 DIAGNOSIS — R921 Mammographic calcification found on diagnostic imaging of breast: Secondary | ICD-10-CM

## 2017-04-20 DIAGNOSIS — C000 Malignant neoplasm of external upper lip: Secondary | ICD-10-CM | POA: Insufficient documentation

## 2018-04-07 ENCOUNTER — Other Ambulatory Visit: Payer: Self-pay | Admitting: Obstetrics and Gynecology

## 2018-04-07 DIAGNOSIS — R921 Mammographic calcification found on diagnostic imaging of breast: Secondary | ICD-10-CM

## 2018-04-16 ENCOUNTER — Ambulatory Visit
Admission: RE | Admit: 2018-04-16 | Discharge: 2018-04-16 | Disposition: A | Discharge status: Home / Self Care | Payer: BLUE CROSS/BLUE SHIELD | Source: Ambulatory Visit | Attending: Obstetrics and Gynecology | Admitting: Obstetrics and Gynecology

## 2018-04-16 DIAGNOSIS — R921 Mammographic calcification found on diagnostic imaging of breast: Secondary | ICD-10-CM

## 2019-03-15 ENCOUNTER — Other Ambulatory Visit: Payer: Self-pay | Admitting: Internal Medicine

## 2019-03-17 ENCOUNTER — Other Ambulatory Visit: Payer: Self-pay | Admitting: Obstetrics and Gynecology

## 2019-03-17 DIAGNOSIS — Z1231 Encounter for screening mammogram for malignant neoplasm of breast: Secondary | ICD-10-CM

## 2019-03-17 DIAGNOSIS — N6001 Solitary cyst of right breast: Secondary | ICD-10-CM

## 2019-03-17 DIAGNOSIS — R921 Mammographic calcification found on diagnostic imaging of breast: Secondary | ICD-10-CM

## 2019-04-19 ENCOUNTER — Ambulatory Visit
Admission: RE | Admit: 2019-04-19 | Discharge: 2019-04-19 | Disposition: A | Discharge status: Home / Self Care | Payer: BC Managed Care – PPO | Source: Ambulatory Visit | Attending: Obstetrics and Gynecology | Admitting: Obstetrics and Gynecology

## 2019-04-19 DIAGNOSIS — N6001 Solitary cyst of right breast: Secondary | ICD-10-CM | POA: Diagnosis present

## 2019-04-19 DIAGNOSIS — R921 Mammographic calcification found on diagnostic imaging of breast: Secondary | ICD-10-CM | POA: Diagnosis present

## 2019-04-19 DIAGNOSIS — Z1231 Encounter for screening mammogram for malignant neoplasm of breast: Secondary | ICD-10-CM | POA: Diagnosis present

## 2019-10-24 ENCOUNTER — Ambulatory Visit: Payer: BC Managed Care – PPO | Attending: Internal Medicine

## 2019-10-24 ENCOUNTER — Other Ambulatory Visit: Payer: Self-pay

## 2019-10-24 DIAGNOSIS — Z23 Encounter for immunization: Secondary | ICD-10-CM

## 2019-10-24 NOTE — Progress Notes (Signed)
   Covid-19 Vaccination Clinic  Name:  Veronica Walsh    MRN: [id number] DOB: 08-04-1965  10/24/2019  [name] was observed post Covid-19 immunization for 15 minutes without incident. She was provided with Vaccine Information Sheet and instruction to access the V-Safe system.   [name] was instructed to call 911 with any severe reactions post vaccine: Marland Kitchen Difficulty breathing  . Swelling of face and throat  . A fast heartbeat  . A bad rash all over body  . Dizziness and weakness   Immunizations Administered    Name Date Dose VIS Date Route   Pfizer COVID-19 Vaccine 10/24/2019  9:17 AM 0.3 mL 07/01/2019 Intramuscular   Manufacturer: [place]   Lot: [id number]   [place]: [id number]

## 2019-11-16 ENCOUNTER — Ambulatory Visit: Payer: BC Managed Care – PPO | Attending: Internal Medicine

## 2019-11-16 DIAGNOSIS — Z23 Encounter for immunization: Secondary | ICD-10-CM

## 2019-11-16 NOTE — Progress Notes (Signed)
   Covid-19 Vaccination Clinic  Name:  Veronica Walsh    MRN: [id number] DOB: 06/10/1966  11/16/2019  Veronica Walsh was observed post Covid-19 immunization for 15 minutes without incident. She was provided with Vaccine Information Sheet and instruction to access the V-Safe system.   Veronica Walsh was instructed to call 911 with any severe reactions post vaccine: Marland Kitchen Difficulty breathing  . Swelling of face and throat  . A fast heartbeat  . A bad rash all over body  . Dizziness and weakness   Immunizations Administered    Name Date Dose VIS Date Route   Pfizer COVID-19 Vaccine 11/16/2019  9:44 AM 0.3 mL 09/14/2018 Intramuscular   Manufacturer: [place]   Lot: [id number]   [place]: [id number]

## 2020-03-19 ENCOUNTER — Other Ambulatory Visit: Payer: Self-pay | Admitting: Obstetrics and Gynecology

## 2020-03-19 DIAGNOSIS — Z1231 Encounter for screening mammogram for malignant neoplasm of breast: Secondary | ICD-10-CM

## 2020-04-19 ENCOUNTER — Ambulatory Visit
Admission: RE | Admit: 2020-04-19 | Discharge: 2020-04-19 | Disposition: A | Discharge status: Home / Self Care | Payer: BC Managed Care – PPO | Source: Ambulatory Visit | Attending: Obstetrics and Gynecology | Admitting: Obstetrics and Gynecology

## 2020-04-19 ENCOUNTER — Other Ambulatory Visit: Payer: Self-pay

## 2020-04-19 DIAGNOSIS — Z1231 Encounter for screening mammogram for malignant neoplasm of breast: Secondary | ICD-10-CM

## 2020-06-11 ENCOUNTER — Ambulatory Visit: Payer: BC Managed Care – PPO

## 2020-06-18 ENCOUNTER — Ambulatory Visit: Payer: BC Managed Care – PPO | Attending: Internal Medicine

## 2020-06-18 DIAGNOSIS — Z23 Encounter for immunization: Secondary | ICD-10-CM

## 2020-06-18 NOTE — Progress Notes (Signed)
   Covid-19 Vaccination Clinic  Name:  Latonja Bobeck    MRN: [id number] DOB: [birth year]  06/18/2020  Ms. Hamor was observed post Covid-19 immunization for 15 minutes without incident. She was provided with Vaccine Information Sheet and instruction to access the V-Safe system.   Ms. Mucha was instructed to call 911 with any severe reactions post vaccine: Marland Kitchen Difficulty breathing  . Swelling of face and throat  . A fast heartbeat  . A bad rash all over body  . Dizziness and weakness   Immunizations Administered    Name Date Dose VIS Date Route   Pfizer COVID-19 Vaccine 06/18/2020  1:02 PM 0.3 mL 05/09/2020 Intramuscular   Manufacturer: [place]   Lot: [id number]   [place]: [id number]

## 2021-04-15 ENCOUNTER — Other Ambulatory Visit: Payer: Self-pay | Admitting: Obstetrics and Gynecology

## 2021-04-15 DIAGNOSIS — Z1231 Encounter for screening mammogram for malignant neoplasm of breast: Secondary | ICD-10-CM

## 2021-04-26 ENCOUNTER — Ambulatory Visit
Admission: RE | Admit: 2021-04-26 | Discharge: 2021-04-26 | Disposition: A | Discharge status: Home / Self Care | Payer: BC Managed Care – PPO | Source: Ambulatory Visit | Attending: Obstetrics and Gynecology | Admitting: Obstetrics and Gynecology

## 2021-04-26 ENCOUNTER — Other Ambulatory Visit: Payer: Self-pay

## 2021-04-26 DIAGNOSIS — Z1231 Encounter for screening mammogram for malignant neoplasm of breast: Secondary | ICD-10-CM | POA: Diagnosis present

## 2021-05-02 ENCOUNTER — Other Ambulatory Visit: Payer: Self-pay | Admitting: Obstetrics and Gynecology

## 2021-05-02 DIAGNOSIS — R928 Other abnormal and inconclusive findings on diagnostic imaging of breast: Secondary | ICD-10-CM

## 2021-05-02 DIAGNOSIS — R921 Mammographic calcification found on diagnostic imaging of breast: Secondary | ICD-10-CM

## 2021-05-14 ENCOUNTER — Ambulatory Visit
Admission: RE | Admit: 2021-05-14 | Discharge: 2021-05-14 | Disposition: A | Discharge status: Home / Self Care | Payer: BC Managed Care – PPO | Source: Ambulatory Visit | Attending: Obstetrics and Gynecology | Admitting: Obstetrics and Gynecology

## 2021-05-14 ENCOUNTER — Other Ambulatory Visit: Payer: Self-pay

## 2021-05-14 DIAGNOSIS — R928 Other abnormal and inconclusive findings on diagnostic imaging of breast: Secondary | ICD-10-CM | POA: Insufficient documentation

## 2021-05-14 DIAGNOSIS — R921 Mammographic calcification found on diagnostic imaging of breast: Secondary | ICD-10-CM | POA: Diagnosis present

## 2022-04-15 ENCOUNTER — Other Ambulatory Visit: Payer: Self-pay | Admitting: Obstetrics and Gynecology

## 2022-04-16 ENCOUNTER — Other Ambulatory Visit: Payer: Self-pay | Admitting: Obstetrics and Gynecology

## 2022-04-16 DIAGNOSIS — Z1231 Encounter for screening mammogram for malignant neoplasm of breast: Secondary | ICD-10-CM

## 2023-07-28 IMAGING — MG MM DIGITAL DIAGNOSTIC UNILAT*R* W/ TOMO W/ CAD
5 series · 6 of 9 positions shown · non-contrast
Comparison: Previous exam(s).

CLINICAL DATA: Patient returns after screening study for evaluation
of RIGHT breast calcifications.

EXAM:
DIGITAL DIAGNOSTIC UNILATERAL RIGHT MAMMOGRAM WITH TOMOSYNTHESIS AND
CAD
TECHNIQUE: Right digital diagnostic mammography and breast tomosynthesis was
performed. The images were evaluated with computer-aided detection.

[R ML (1 of 2)]
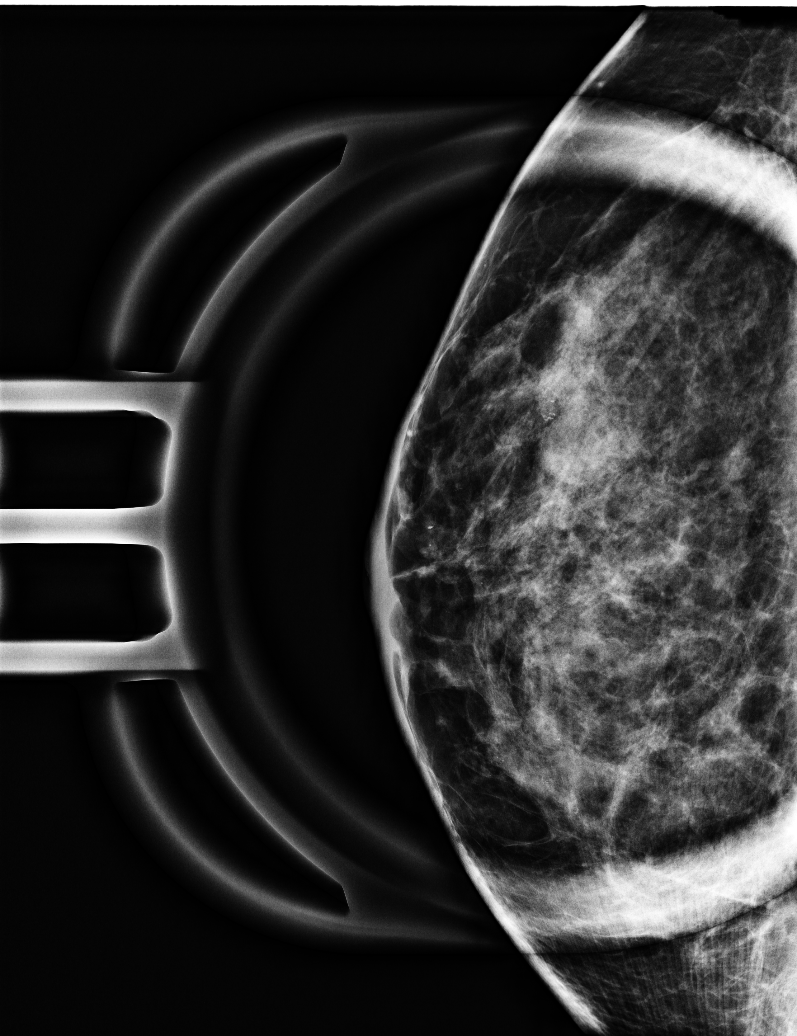

[R CC]
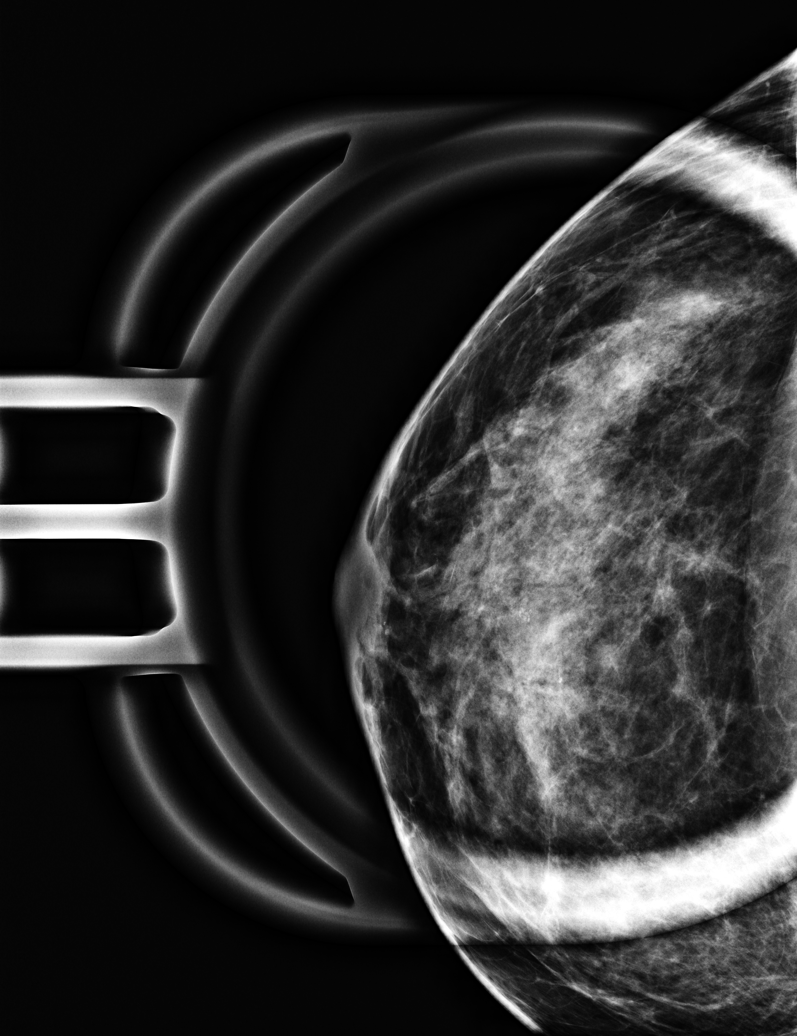

[R ML synth-2D]
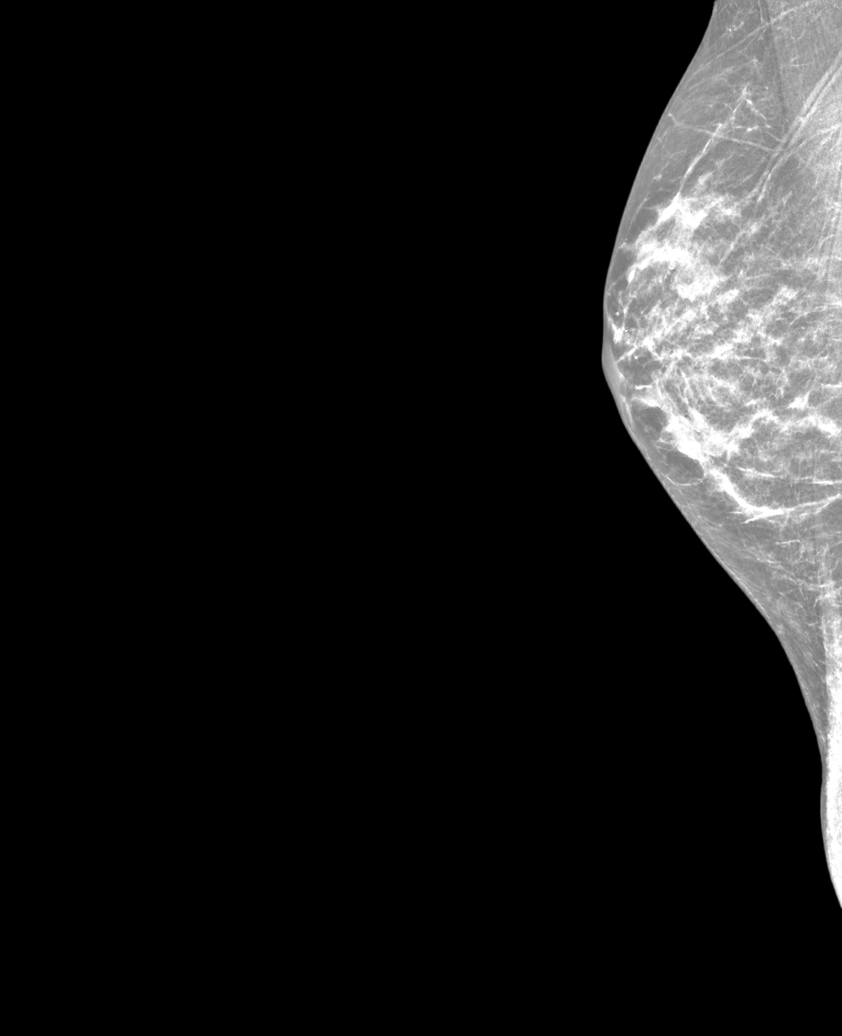

[R ML (2 of 2)]
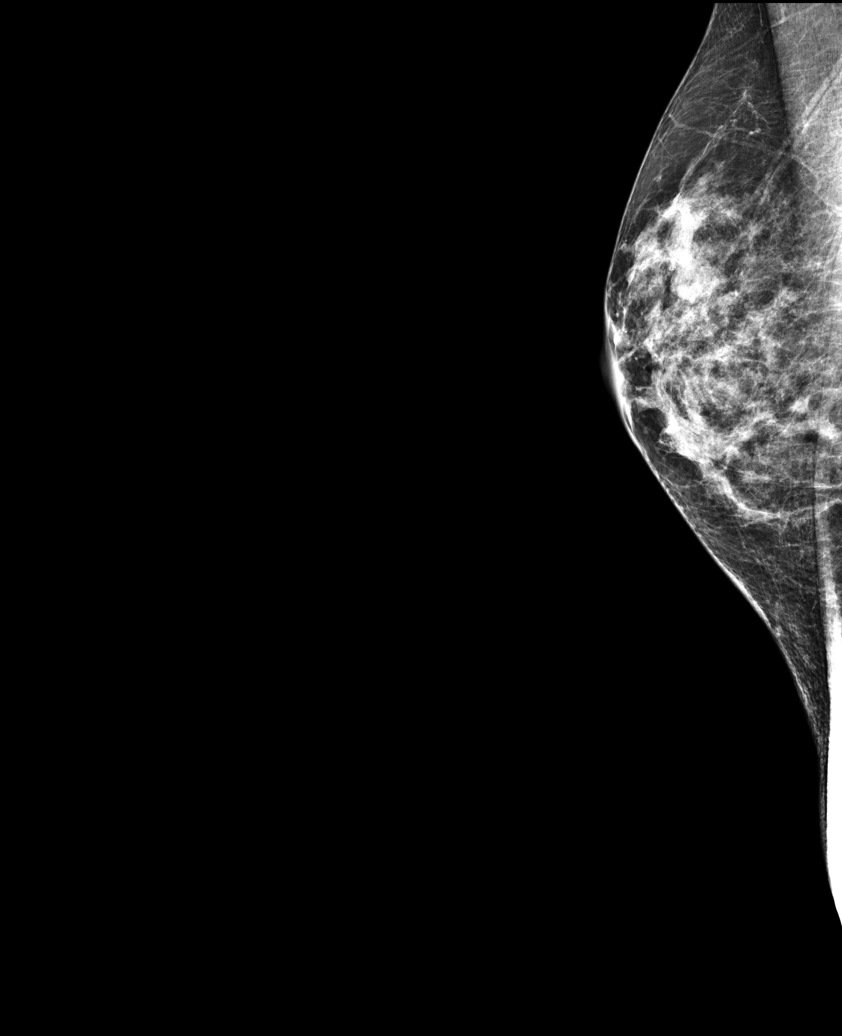

[R ML tomo · 2 of 43 frames shown]
[frame 14/43]
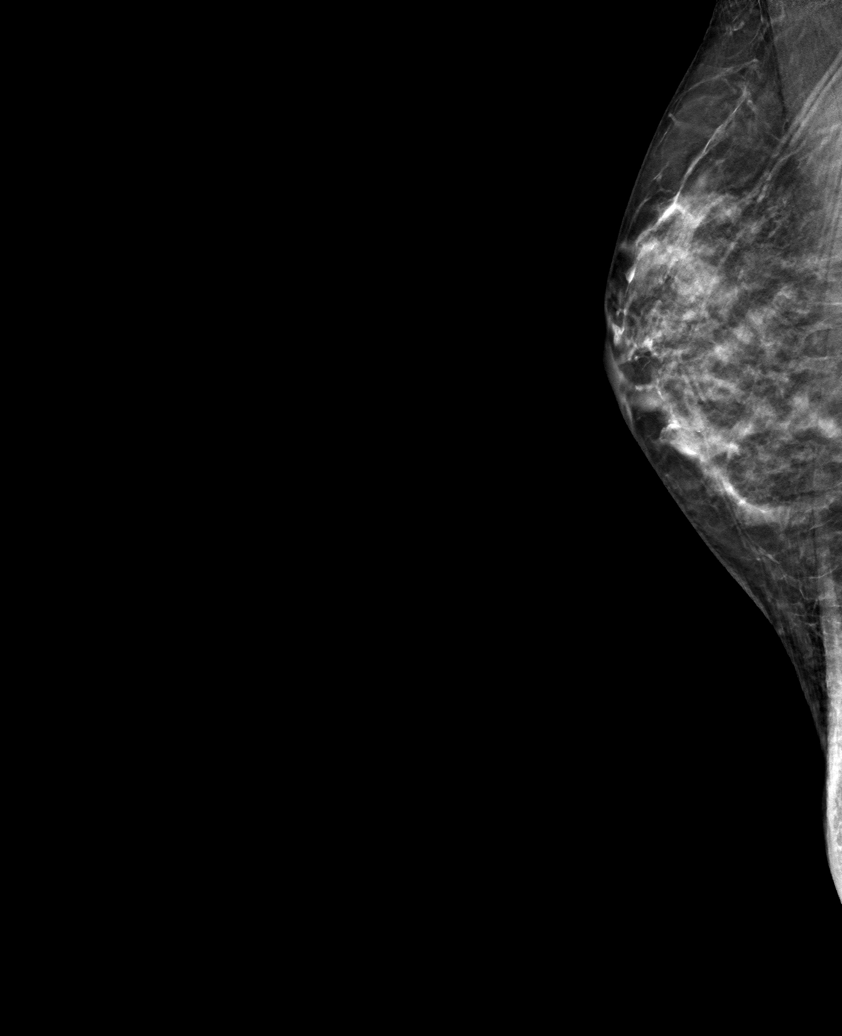
[frame 22/43]
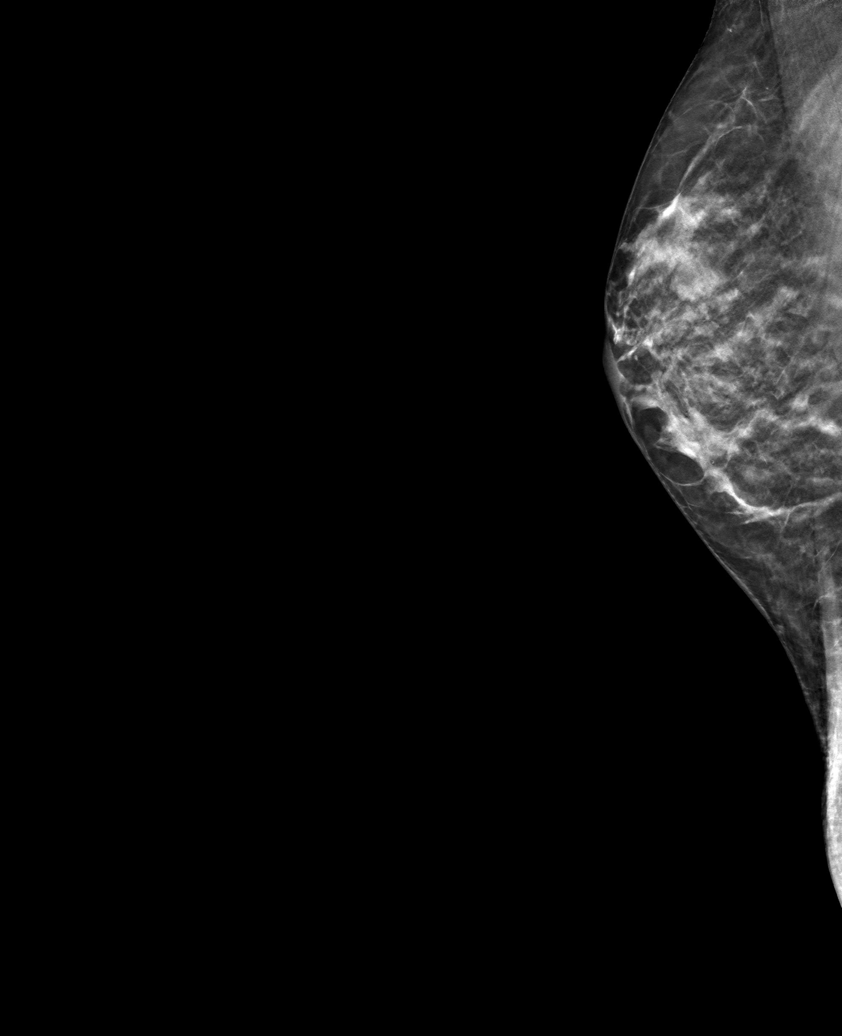

[6 of 9 positions shown; findings below may reference images not displayed]

ACR Breast Density Category c: The breast tissue is heterogeneously
dense, which may obscure small masses.
FINDINGS: Magnified views are performed of calcifications in the UPPER
retroareolar region of the RIGHT breast. On magnified views
calcifications layer and are consistent with milk of calcium.
Calcifications have no suspicious morphology or distribution.
IMPRESSION: Benign RIGHT breast calcifications.

RECOMMENDATION:
Screening mammogram in one year.(Code:[id number])

I have discussed the findings and recommendations with the patient.
If applicable, a reminder letter will be sent to the patient
regarding the next appointment.

BI-RADS CATEGORY  2: Benign.
